# Patient Record
Sex: Female | Born: 1965 | Race: White | Hispanic: No | Marital: Married | Smoking: Current every day smoker
Health system: Southern US, Community
[De-identification: ages and names within clinical notes are randomized; demographics above are authoritative.]

## PROBLEM LIST (undated history)

## (undated) HISTORY — PX: WISDOM TOOTH EXTRACTION: SHX21

---

## 2006-11-05 ENCOUNTER — Emergency Department: Payer: Self-pay | Admitting: Emergency Medicine

## 2007-06-26 ENCOUNTER — Ambulatory Visit: Payer: Self-pay | Admitting: Obstetrics & Gynecology

## 2010-06-20 ENCOUNTER — Ambulatory Visit: Payer: Self-pay | Admitting: Family Medicine

## 2011-08-14 ENCOUNTER — Ambulatory Visit: Payer: Self-pay | Admitting: Family Medicine

## 2014-04-04 ENCOUNTER — Ambulatory Visit: Payer: Self-pay | Admitting: Physician Assistant

## 2014-06-04 ENCOUNTER — Ambulatory Visit: Payer: Self-pay | Admitting: Obstetrics and Gynecology

## 2015-03-11 ENCOUNTER — Other Ambulatory Visit: Payer: Self-pay | Admitting: Obstetrics and Gynecology

## 2015-03-11 DIAGNOSIS — Z1231 Encounter for screening mammogram for malignant neoplasm of breast: Secondary | ICD-10-CM

## 2015-04-05 ENCOUNTER — Ambulatory Visit
Admission: EM | Admit: 2015-04-05 | Discharge: 2015-04-05 | Disposition: A | Discharge status: Home / Self Care | Payer: PRIVATE HEALTH INSURANCE | Attending: Family Medicine | Admitting: Family Medicine

## 2015-04-05 ENCOUNTER — Encounter: Payer: Self-pay | Admitting: *Deleted

## 2015-04-05 DIAGNOSIS — J0101 Acute recurrent maxillary sinusitis: Secondary | ICD-10-CM

## 2015-04-05 LAB — RAPID STREP SCREEN (MED CTR MEBANE ONLY): Streptococcus, Group A Screen (Direct): NEGATIVE

## 2015-04-05 MED ORDER — AMOXICILLIN-POT CLAVULANATE 875-125 MG PO TABS: 1.0000 | ORAL_TABLET | Freq: Two times a day (BID) | ORAL | Status: DC

## 2015-04-05 MED ORDER — FLUTICASONE PROPIONATE 50 MCG/ACT NA SUSP: 2.0000 | Freq: Every day | NASAL | Status: DC

## 2015-04-05 NOTE — ED Provider Notes (Signed)
[facility] Emergency Department Provider Note  ____________________________________________  Time seen: Approximately 8:03 PM  I have reviewed the triage vital signs and the nursing notes.   HISTORY  Chief Complaint Sore Throat; Otalgia; and Headache   HPI Sierra Hart is a 49 y.o. female presents for complaints of runny nose, congestion, bilateral earache and sinus pressure. States present 1 week. States history of similar one month ago and was treated with oral Z-Pak which resolved symptoms. States for the most part symptoms fully resolved after Z-Pak. States last week symptoms started with runny nose and then has progressed to sinus pressure and pain. Patient does report some history of seasonal allergies and has been taking over-the-counter Claritin with some help. Denies fever but reports this felt warm with chills. States occasional cough. His cough is primarily at night with drainage on back of throat. Denies chest pain, shortness of breath, wheezing or fever. Reports continues to eat and drink well. States current sinus pain is described as a pressure at 5 out of 10. States right ear is comfort is a aching pain at 3 out of 10. Reports thick purulent green nasal drainage.   History reviewed. No pertinent past medical history.  There are no active problems to display for this patient.   History reviewed. No pertinent past surgical history.  No current outpatient prescriptions on file.  Allergies Review of patient's allergies indicates no known allergies.  No family history on file.  Social History Social History  Substance Use Topics  . Smoking status: Current Every Day Smoker  . Smokeless tobacco: None  . Alcohol Use: Yes    Review of Systems Constitutional: No fever/chills Eyes: No visual changes. ENT: positive for runny nose, congestion, sore throat and intermittent cough. Cardiovascular: Denies chest pain. Respiratory: Denies  shortness of breath. Gastrointestinal: No abdominal pain.  No nausea, no vomiting.  No diarrhea.  No constipation. Genitourinary: Negative for dysuria. Musculoskeletal: Negative for back pain. Skin: Negative for rash. Neurological: Negative for headaches, focal weakness or numbness.  10-point ROS otherwise negative.  ____________________________________________   PHYSICAL EXAM:  VITAL SIGNS: ED Triage Vitals  Enc Vitals Group     BP 04/05/15 1830 144/78 mmHg     Pulse Rate 04/05/15 1830 61     Resp -- 18     Temp 04/05/15 1830 98.1 F (36.7 C)     Temp Source 04/05/15 1830 Oral     SpO2 04/05/15 1830 100 %     Weight 04/05/15 1830 146 lb (66.225 kg)     Height 04/05/15 1830  (1.702 m)     Head Cir --      Peak Flow --      Pain Score --      Pain Loc --      Pain Edu? --      Excl. in GC? --     Constitutional: Alert and oriented. Well appearing and in no acute distress. Eyes: Conjunctivae are normal. PERRL. EOMI. Head: Atraumatic.mod TTP maxillary sinus, no swelling or erythema.   Ears: no erythema, normal TMs bilaterally.   Nose: mild clear rhinorrhea, bilateral nasal turbinate edema and mild erythema, bilateral nares patent.   Mouth/Throat: Mucous membranes are moist.  Mild pharyngeal erythema. No tonsillar swelling or exudate.  Neck: No stridor.  No cervical spine tenderness to palpation. Hematological/Lymphatic/Immunilogical: No cervical lymphadenopathy. Cardiovascular: Normal rate, regular rhythm. Grossly normal heart sounds.  Good peripheral circulation. Respiratory: Normal respiratory effort.  No retractions. Lungs  CTAB. Gastrointestinal: Soft and nontender. No distention. Normal Bowel sounds.  No abdominal bruits. No CVA tenderness. Musculoskeletal: No lower or upper extremity tenderness nor edema.  No joint effusions. Bilateral pedal pulses equal and easily palpated.  Neurologic:  Normal speech and language. No gross focal neurologic deficits are  appreciated. No gait instability. Skin:  Skin is warm, dry and intact. No rash noted. Psychiatric: Mood and affect are normal. Speech and behavior are normal.  ____________________________________________   LABS (all labs ordered are listed, but only abnormal results are displayed)  Labs Reviewed  RAPID STREP SCREEN (NOT AT [facility])  CULTURE, GROUP A STREP ([facility] ONLY)   _ INITIAL IMPRESSION / ASSESSMENT AND PLAN / ED COURSE  Pertinent labs & imaging results that were available during my care of the patient were reviewed by me and considered in my medical decision making (see chart for details).  Very well appearing. No acute distress. Continues to eat and drink well. Will treat with oral augmentin and fluticasone. Discussed supportive treatments, rest fluids and follow up with PCP. Patient verbalized understanding and agreed to plan.  ____________________________________________   FINAL CLINICAL IMPRESSION(S) / ED DIAGNOSES  Final diagnoses:  Acute recurrent maxillary sinusitis       Renford Dills, NP 04/05/15 2018

## 2015-04-05 NOTE — Discharge Instructions (Signed)
Take medication as prescribed. Rest. Fluids. Follow up with your primary care physician as needed. Return to Urgent care for new or worsening concerns.   Sinusitis Sinusitis is redness, soreness, and puffiness (inflammation) of the air pockets in the bones of your face (sinuses). The redness, soreness, and puffiness can cause air and mucus to get trapped in your sinuses. This can allow germs to grow and cause an infection.  HOME CARE   Drink enough fluids to keep your pee (urine) clear or pale yellow.  Use a humidifier in your home.  Run a hot shower to create steam in the bathroom. Sit in the bathroom with the door closed. Breathe in the steam 3-4 times a day.  Put a warm, moist washcloth on your face 3-4 times a day, or as told by your doctor.  Use salt water sprays (saline sprays) to wet the thick fluid in your nose. This can help the sinuses drain.  Only take medicine as told by your doctor. GET HELP RIGHT AWAY IF:   Your pain gets worse.  You have very bad headaches.  You are sick to your stomach (nauseous).  You throw up (vomit).  You are very sleepy (drowsy) all the time.  Your face is puffy (swollen).  Your vision changes.  You have a stiff neck.  You have trouble breathing. MAKE SURE YOU:   Understand these instructions.  Will watch your condition.  Will get help right away if you are not doing well or get worse. Document Released: 12/26/2007 Document Revised: 04/02/2012 Document Reviewed: 02/12/2012 [facility] Patient Information 2015 [place], [place]. This information is not intended to replace advice given to you by your health care provider. Make sure you discuss any questions you have with your health care provider.

## 2015-04-05 NOTE — ED Notes (Signed)
Pt states that she has been having sore throat, headache and earache started last week.  Was given a zpack on the aug 18th by her PCP.

## 2015-04-07 LAB — CULTURE, GROUP A STREP (THRC)

## 2020-11-03 DIAGNOSIS — Z01411 Encounter for gynecological examination (general) (routine) with abnormal findings: Secondary | ICD-10-CM | POA: Diagnosis not present

## 2020-11-03 DIAGNOSIS — R87811 Vaginal high risk human papillomavirus (HPV) DNA test positive: Secondary | ICD-10-CM | POA: Diagnosis not present

## 2020-11-03 DIAGNOSIS — R87610 Atypical squamous cells of undetermined significance on cytologic smear of cervix (ASC-US): Secondary | ICD-10-CM | POA: Diagnosis not present

## 2020-11-03 DIAGNOSIS — N870 Mild cervical dysplasia: Secondary | ICD-10-CM | POA: Diagnosis not present

## 2020-11-15 DIAGNOSIS — Z01419 Encounter for gynecological examination (general) (routine) without abnormal findings: Secondary | ICD-10-CM | POA: Diagnosis not present

## 2020-12-08 DIAGNOSIS — Z13220 Encounter for screening for lipoid disorders: Secondary | ICD-10-CM | POA: Diagnosis not present

## 2020-12-08 DIAGNOSIS — Z1329 Encounter for screening for other suspected endocrine disorder: Secondary | ICD-10-CM | POA: Diagnosis not present

## 2020-12-08 DIAGNOSIS — Z131 Encounter for screening for diabetes mellitus: Secondary | ICD-10-CM | POA: Diagnosis not present

## 2020-12-08 DIAGNOSIS — Z1321 Encounter for screening for nutritional disorder: Secondary | ICD-10-CM | POA: Diagnosis not present

## 2021-03-28 ENCOUNTER — Ambulatory Visit
Admission: EM | Admit: 2021-03-28 | Discharge: 2021-03-28 | Disposition: A | Discharge status: Home / Self Care | Payer: BC Managed Care – PPO | Attending: Family | Admitting: Family

## 2021-03-28 ENCOUNTER — Other Ambulatory Visit: Payer: Self-pay

## 2021-03-28 DIAGNOSIS — R112 Nausea with vomiting, unspecified: Secondary | ICD-10-CM

## 2021-03-28 DIAGNOSIS — R202 Paresthesia of skin: Secondary | ICD-10-CM | POA: Diagnosis not present

## 2021-03-28 DIAGNOSIS — R42 Dizziness and giddiness: Secondary | ICD-10-CM | POA: Diagnosis not present

## 2021-03-28 DIAGNOSIS — R200 Anesthesia of skin: Secondary | ICD-10-CM | POA: Diagnosis not present

## 2021-03-28 MED ORDER — ONDANSETRON 8 MG PO TBDP: 8.0000 mg | ORAL_TABLET | Freq: Three times a day (TID) | ORAL | 0 refills | Status: AC | PRN

## 2021-03-28 MED ORDER — ONDANSETRON 8 MG PO TBDP
8.0000 mg | ORAL_TABLET | Freq: Once | ORAL | Status: AC
  Administered 2021-03-28: 8 mg via ORAL

## 2021-03-28 NOTE — Discharge Instructions (Addendum)
Recommend continue to monitor symptoms. Change positions slowly. May continue Zofran 8mg  every 8 hours as needed for nausea/vomiting. Try to push water and Powerade/Gatorade. If numbness continues, dizziness worsens and headache develops or change in vision and unable to keep fluids or food down, call 911 and go to the ER ASAP. Otherwise, follow-up with your PCP to discuss further testing and evaluation which may include imaging.

## 2021-03-28 NOTE — ED Triage Notes (Addendum)
Pt c/o left hand tingling, lip numbness, some stinging in the inner corner of her right eye, dizziness - symptoms have been progressing since about 9am today. Pt also reports some nausea and emesis about lunchtime. Pt went to the [facility] and was advised to come to UC.

## 2021-03-28 NOTE — ED Provider Notes (Signed)
[facility]    CSN: [id number] Arrival date & time: 03/28/21  1523      History   Chief Complaint Chief Complaint  Patient presents with   Dizziness   Numbness    Lips and left hand    HPI Sierra EkeKelly Amber Hart is a 55 y.o. female.   55 year old female presents with intermittent numbness and tingling of her right face, left hand and dizziness that started this morning.  First experienced some stinging and numbness in the corner of her right eye which has occurred once before.  Then noticed the numbness was spreading down along her nose to the right side of her lip.  That has improved but still feels lip is irritated and swollen.  Then started experiencing left hand tingling and numbness that also is slightly better but intermittent.  Next started experiencing more dizziness especially when moving or changing positions.  The symptoms started when she was drinking coffee at a shop near her work.  By lunchtime she started experiencing more nausea and vomited her lunch.  [facility] and they told her to come to urgent care or the hospital.  She denies any headache, change in vision, tinnitus, nasal congestion, chest pain, shortness of breath, lower extremity numbness or tingling. No previous similar symptoms except for the right inner eye irritation. She recently had routine lab work with her GYN which was all normal except slightly elevated lipids. She does smoke tobacco but no alcohol or illicit drug use. No recent travel. No recent vaccines. Still feels nauseous this afternoon and her son drove her here. Does have positive family history for stoke in mom and brother along with HTN. No other chronic health issues. Takes no daily medication.   The history is provided by the patient.   No past medical history on file.  There are no problems to display for this patient.   No past surgical history on file.  OB History   No obstetric history on  file.      Home Medications    Prior to Admission medications   Medication Sig Start Date End Date Taking? Authorizing Provider  ondansetron (ZOFRAN ODT) 8 MG disintegrating tablet Take 1 tablet (8 mg total) by mouth every 8 (eight) hours as needed for nausea or vomiting. 03/28/21  Yes Glessie Eustice, Ali LoweAnn Berry, NP    Family History No family history on file.  Social History Social History   Tobacco Use   Smoking status: Every Day    Types: Cigarettes   Smokeless tobacco: Never  Vaping Use   Vaping Use: Never used  Substance Use Topics   Alcohol use: Yes     Allergies   Patient has no known allergies.   Review of Systems Review of Systems  Constitutional:  Positive for appetite change. Negative for chills, diaphoresis, fatigue and fever.  HENT:  Negative for congestion, dental problem, drooling, ear discharge, ear pain, facial swelling, hearing loss, mouth sores, nosebleeds, postnasal drip, rhinorrhea, sinus pressure, sinus pain, sore throat, tinnitus and trouble swallowing.   Eyes:  Positive for pain (right corner of eye "stinging"). Negative for photophobia, discharge, redness, itching and visual disturbance.  Respiratory:  Negative for cough, chest tightness, shortness of breath and wheezing.   Cardiovascular:  Negative for chest pain, palpitations and leg swelling.  Gastrointestinal:  Positive for nausea and vomiting. Negative for abdominal pain.  Musculoskeletal:  Positive for gait problem. Negative for arthralgias,  back pain and myalgias.  Skin:  Negative for color change and rash.  Allergic/Immunologic: Negative for environmental allergies, food allergies and immunocompromised state.  Neurological:  Positive for dizziness and numbness. Negative for tremors, seizures, syncope, facial asymmetry, speech difficulty, weakness and headaches.  Hematological:  Negative for adenopathy. Does not bruise/bleed easily.    Physical Exam Triage Vital Signs ED Triage Vitals  Enc Vitals  Group     BP 03/28/21 1558 (!) 156/86     Pulse Rate 03/28/21 1558 88     Resp 03/28/21 1558 18     Temp 03/28/21 1558 98.3 F (36.8 C)     Temp Source 03/28/21 1558 Oral     SpO2 03/28/21 1558 91 %     Weight 03/28/21 1556 165 lb (74.8 kg)     Height 03/28/21 1556 5\' 6"  (1.676 m)     Head Circumference --      Peak Flow --      Pain Score 03/28/21 1556 6     Pain Loc --      Pain Edu? --      Excl. in GC? --    No data found.  Updated Vital Signs BP 128/81 (BP Location: Right Arm)   Pulse 88   Temp 98.3 F (36.8 C) (Oral)   Resp 18   Ht 5\' 6"  (1.676 m)   Wt 165 lb (74.8 kg)   SpO2 91%   BMI 26.63 kg/m  Rechecked SpO2 level at 1640 and was 95% in right index finger and 96% in left index finger.   Visual Acuity Right Eye Distance:   Left Eye Distance:   Bilateral Distance:    Right Eye Near:   Left Eye Near:    Bilateral Near:     Physical Exam Vitals and nursing note reviewed.  Constitutional:      General: She is awake. She is not in acute distress.    Appearance: She is well-developed and well-groomed. She is not ill-appearing.     Comments: She is sitting comfortably in a wheel chair in no acute distress, talking in complete sentences.   HENT:     Head: Normocephalic and atraumatic.     Right Ear: Hearing, tympanic membrane, ear canal and external ear normal.     Left Ear: Hearing, tympanic membrane, ear canal and external ear normal.     Nose: Nose normal.     Right Sinus: No maxillary sinus tenderness or frontal sinus tenderness.     Left Sinus: No maxillary sinus tenderness or frontal sinus tenderness.     Mouth/Throat:     Lips: Pink.     Mouth: Mucous membranes are moist. No oral lesions.     Pharynx: Oropharynx is clear. Uvula midline. No pharyngeal swelling, oropharyngeal exudate, posterior oropharyngeal erythema or uvula swelling.      Comments: Slight redness/irritation from rubbing her upper lip due to previous numbness.  Eyes:     General:  Lids are normal. Vision grossly intact. No visual field deficit.    Extraocular Movements: Extraocular movements intact.     Conjunctiva/sclera: Conjunctivae normal.     Pupils: Pupils are equal, round, and reactive to light.  Neck:     Vascular: No carotid bruit.  Cardiovascular:     Rate and Rhythm: Normal rate and regular rhythm.     Pulses: Normal pulses.     Heart sounds: Normal heart sounds. No murmur heard. Pulmonary:     Effort: Pulmonary effort is normal.  No respiratory distress.     Breath sounds: Normal breath sounds and air entry. No decreased air movement. No decreased breath sounds, wheezing, rhonchi or rales.  Musculoskeletal:        General: Normal range of motion.     Right hand: Normal. No tenderness. Normal range of motion. Normal strength. Normal sensation.     Left hand: Normal. No tenderness. Normal range of motion. Normal strength. Normal sensation.     Cervical back: Normal range of motion and neck supple. No rigidity or tenderness.     Comments: No neuro deficits noted in left hand/arm or fingers.   Lymphadenopathy:     Cervical: No cervical adenopathy.  Skin:    General: Skin is warm and dry.     Capillary Refill: Capillary refill takes less than 2 seconds.     Findings: No rash.  Neurological:     General: No focal deficit present.     Mental Status: She is alert and oriented to person, place, and time.     Cranial Nerves: Cranial nerves are intact. No cranial nerve deficit or facial asymmetry.     Sensory: Sensation is intact. No sensory deficit.     Motor: Motor function is intact. No weakness or tremor.     Coordination: Coordination is intact. Coordination normal.     Gait: Gait abnormal.     Deep Tendon Reflexes: Reflexes are normal and symmetric.     Comments: She needs assistance with changing positions due to dizziness and unstable gait but otherwise no neuro deficits noted.   Psychiatric:        Mood and Affect: Mood normal.        Behavior:  Behavior normal. Behavior is cooperative.        Thought Content: Thought content normal.        Judgment: Judgment normal.     UC Treatments / Results  Labs (all labs ordered are listed, but only abnormal results are displayed) Labs Reviewed - No data to display  EKG   Radiology No results found.  Procedures Procedures (including critical care time)  Medications Ordered in UC Medications  ondansetron (ZOFRAN-ODT) disintegrating tablet 8 mg (8 mg Oral Given 03/28/21 1706)    Initial Impression / Assessment and Plan / UC Course  I have reviewed the triage vital signs and the nursing notes.  Pertinent labs & imaging results that were available during my care of the patient were reviewed by me and considered in my medical decision making (see chart for details).     Gave Zofran 8mg  now to help with nausea. Patient indicated that she still feels dizzy but nausea has improved after 15-20 minutes. Reviewed with patient uncertain of etiology of her symptoms. Doubt stoke, especially since she is experiencing symptoms in various locations on both sides of her body and are not progressing. No history of diabetes and her recent lab work per patient was reassuring (unable to pull up lab results in Epic). After 5pm now and unable to perform any in-house STAT blood work. She is stable with blood pressure improved and similar in both arms. Oxygen saturation level is now normal. She continues to talk in complete sentences and is in no distress. Discussed that she may need further evaluation/imaging if symptoms continue. Recommend continue to monitor symptoms for now. Change positions slowly. Try to push water/fluids and Powerade/Gatorade to stay well hydrated. May continue Zofran 8mg  ODT every 8 hours as needed for nausea. If numbness continues, dizziness  worsens or any headache, change in vision occurs or unable to keep down any fluids, call 911 and go to the ER ASAP. Otherwise, follow-up/call your  PCP tomorrow to discuss further evaluation and possible additional testing/imaging to help determine etiology of symptoms.  Final Clinical Impressions(s) / UC Diagnoses   Final diagnoses:  Dizziness  Numbness of lip  Nausea and vomiting, intractability of vomiting not specified, unspecified vomiting type  Paresthesias in left hand     Discharge Instructions      Recommend continue to monitor symptoms. Change positions slowly. May continue Zofran 8mg  every 8 hours as needed for nausea/vomiting. Try to push water and Powerade/Gatorade. If numbness continues, dizziness worsens and headache develops or change in vision and unable to keep fluids or food down, call 911 and go to the ER ASAP. Otherwise, follow-up with your PCP to discuss further testing and evaluation which may include imaging.      ED Prescriptions     Medication Sig Dispense Auth. Provider   ondansetron (ZOFRAN ODT) 8 MG disintegrating tablet Take 1 tablet (8 mg total) by mouth every 8 (eight) hours as needed for nausea or vomiting. 15 tablet Velton Roselle, , NP      PDMP not reviewed this encounter.   Ali Lowe, NP 03/29/21 1046

## 2021-03-30 DIAGNOSIS — R202 Paresthesia of skin: Secondary | ICD-10-CM | POA: Diagnosis not present

## 2021-05-09 DIAGNOSIS — E782 Mixed hyperlipidemia: Secondary | ICD-10-CM | POA: Diagnosis not present

## 2021-07-31 DIAGNOSIS — R202 Paresthesia of skin: Secondary | ICD-10-CM | POA: Diagnosis not present

## 2021-08-16 ENCOUNTER — Other Ambulatory Visit: Payer: Self-pay | Admitting: Obstetrics and Gynecology

## 2021-08-16 ENCOUNTER — Ambulatory Visit
Admission: RE | Admit: 2021-08-16 | Discharge: 2021-08-16 | Disposition: A | Discharge status: Home / Self Care | Payer: PRIVATE HEALTH INSURANCE | Source: Ambulatory Visit | Attending: Obstetrics and Gynecology | Admitting: Obstetrics and Gynecology

## 2021-08-16 ENCOUNTER — Other Ambulatory Visit: Payer: Self-pay

## 2021-08-16 DIAGNOSIS — Z1231 Encounter for screening mammogram for malignant neoplasm of breast: Secondary | ICD-10-CM

## 2021-10-31 DIAGNOSIS — Z01419 Encounter for gynecological examination (general) (routine) without abnormal findings: Secondary | ICD-10-CM | POA: Diagnosis not present

## 2021-10-31 DIAGNOSIS — I890 Lymphedema, not elsewhere classified: Secondary | ICD-10-CM | POA: Diagnosis not present

## 2021-10-31 DIAGNOSIS — R87610 Atypical squamous cells of undetermined significance on cytologic smear of cervix (ASC-US): Secondary | ICD-10-CM | POA: Diagnosis not present

## 2021-11-01 DIAGNOSIS — Z124 Encounter for screening for malignant neoplasm of cervix: Secondary | ICD-10-CM | POA: Diagnosis not present

## 2021-12-22 ENCOUNTER — Other Ambulatory Visit (INDEPENDENT_AMBULATORY_CARE_PROVIDER_SITE_OTHER): Payer: Self-pay | Admitting: Nurse Practitioner

## 2021-12-22 DIAGNOSIS — R600 Localized edema: Secondary | ICD-10-CM

## 2021-12-22 DIAGNOSIS — I83813 Varicose veins of bilateral lower extremities with pain: Secondary | ICD-10-CM

## 2021-12-25 ENCOUNTER — Encounter (INDEPENDENT_AMBULATORY_CARE_PROVIDER_SITE_OTHER): Payer: Self-pay | Admitting: Vascular Surgery

## 2021-12-25 ENCOUNTER — Ambulatory Visit (INDEPENDENT_AMBULATORY_CARE_PROVIDER_SITE_OTHER): Payer: BC Managed Care – PPO

## 2021-12-25 ENCOUNTER — Ambulatory Visit (INDEPENDENT_AMBULATORY_CARE_PROVIDER_SITE_OTHER): Payer: BC Managed Care – PPO | Admitting: Vascular Surgery

## 2021-12-25 DIAGNOSIS — I872 Venous insufficiency (chronic) (peripheral): Secondary | ICD-10-CM | POA: Diagnosis not present

## 2021-12-25 DIAGNOSIS — I83813 Varicose veins of bilateral lower extremities with pain: Secondary | ICD-10-CM

## 2021-12-25 DIAGNOSIS — R600 Localized edema: Secondary | ICD-10-CM | POA: Diagnosis not present

## 2021-12-25 DIAGNOSIS — I890 Lymphedema, not elsewhere classified: Secondary | ICD-10-CM | POA: Diagnosis not present

## 2021-12-25 DIAGNOSIS — I83819 Varicose veins of unspecified lower extremities with pain: Secondary | ICD-10-CM | POA: Diagnosis not present

## 2021-12-25 NOTE — Progress Notes (Signed)
MRN : [id number]  Sierra Hart is a 56 y.o. (01-13-1966) female who presents with chief complaint of legs hurt and swell.  History of Present Illness:   Patient is seen for evaluation of leg pain and leg swelling. The patient first noticed the swelling remotely. The swelling is associated with pain and discoloration. The pain and swelling worsens with prolonged dependency and improves with elevation. The pain is unrelated to activity.  The patient notes that in the morning the legs are significantly improved but they steadily worsened throughout the course of the day. The patient also notes a steady worsening of the discoloration in the ankle and shin area.   The patient denies claudication symptoms.  The patient denies symptoms consistent with rest pain.  The patient has a past history of LS spine disease, but this has not been a huge problem for her.  The patient has no had any past angiography, interventions or vascular surgery.  Elevation makes the leg symptoms better, dependency makes them much worse. There is no history of ulcerations. The patient denies any recent changes in medications.  The patient has tried wearing graduated compression but found they created pain at the top of the cuff area.  The patient denies a history of DVT or PE. There is no prior history of phlebitis. There is no history of primary lymphedema.  No history of malignancies. No history of trauma or groin or pelvic surgery. There is no history of radiation treatment to the groin or pelvis  No history of CAD The patient denies recent episodes of angina or shortness of breath.  Venous duplex shows a normal deep system bilaterally with mild reflux in the right GSV and severe reflux in the left GSV    No past medical history on file.  No past surgical history on file.  Social History Social History   Tobacco Use   Smoking status: Every Day    Types: Cigarettes   Smokeless tobacco: Never   Vaping Use   Vaping Use: Never used  Substance Use Topics   Alcohol use: Yes    Family History No family history on file.  No Known Allergies   REVIEW OF SYSTEMS (Negative unless checked)  Constitutional: [] Weight loss  [] Fever  [] Chills Cardiac: [] Chest pain   [] Chest pressure   [] Palpitations   [] Shortness of breath when laying flat   [] Shortness of breath with exertion. Vascular:  [] Pain in legs with walking   [x] Pain in legs at rest  [] History of DVT   [] Phlebitis   [x] Swelling in legs   [x] Varicose veins   [] Non-healing ulcers Pulmonary:   [] Uses home oxygen   [] Productive cough   [] Hemoptysis   [] Wheeze  [] COPD   [] Asthma Neurologic:  [] Dizziness   [] Seizures   [] History of stroke   [] History of TIA  [] Aphasia   [] Vissual changes   [] Weakness or numbness in arm   [] Weakness or numbness in leg Musculoskeletal:   [] Joint swelling   [] Joint pain   [] Low back pain Hematologic:  [] Easy bruising  [] Easy bleeding   [] Hypercoagulable state   [] Anemic Gastrointestinal:  [] Diarrhea   [] Vomiting  [] Gastroesophageal reflux/heartburn   [] Difficulty swallowing. Genitourinary:  [] Chronic kidney disease   [] Difficult urination  [] Frequent urination   [] Blood in urine Skin:  [] Rashes   [] Ulcers  Psychological:  [] History of anxiety   []  History of major depression.  Physical Examination  There were no vitals filed for this visit. There is no  height or weight on file to calculate BMI. Gen: WD/WN, NAD Head: [place]/AT, No temporalis wasting.  Ear/Nose/Throat: Hearing grossly intact, nares w/o erythema or drainage, pinna without lesions Eyes: PER, EOMI, sclera nonicteric.  Neck: Supple, no gross masses.  No JVD.  Pulmonary:  Good air movement, no audible wheezing, no use of accessory muscles.  Cardiac: RRR, precordium not hyperdynamic. Vascular:  scattered varicosities present bilaterally.  Moderate venous stasis changes to the legs bilaterally.  2-3+ soft pitting edema left leg greater than  right Vessel Right Left  Radial Palpable Palpable  PT Palpable  Palpable   Gastrointestinal: soft, non-distended. No guarding/no peritoneal signs.  Musculoskeletal: M/S 5/5 throughout.  No deformity.  Neurologic: CN 2-12 intact. Pain and light touch intact in extremities.  Symmetrical.  Speech is fluent. Motor exam as listed above. Psychiatric: Judgment intact, Mood & affect appropriate for pt's clinical situation. Dermatologic: Venous rashes no ulcers noted.  No changes consistent with cellulitis. Lymph : No lichenification or skin changes of chronic lymphedema.  CBC No results found for: WBC, HGB, HCT, MCV, PLT  BMET No results found for: NA, K, CL, CO2, GLUCOSE, BUN, CREATININE, CALCIUM, GFRNONAA, GFRAA CrCl cannot be calculated (No successful lab value found.).  COAG No results found for: INR, PROTIME  Radiology No results found.   Assessment/Plan 1. Varicose veins with pain  Recommend:  The patient is complaining of symptomatic varicose veins associated with venous insufficiency.  The patient describes them as painful, associated with swelling of both legs and ankles, left leg more than right.  The patient notes  this is interfering with daily activities and lifestyle.  I have had a long discussion with the patient regarding  varicose veins and why they cause symptoms as well as the mechanics of venous insufficiency and lymphedema.  Patient will retry to wear graduated compression stockings on a daily basis, beginning first thing in the morning and removing them in the evening. The patient is instructed specifically not to sleep in the stockings.    The patient  will also begin using over-the-counter analgesics such as Motrin 600 mg po TID to help control the symptoms.    In addition, behavioral modification including elevation during the day will be initiated.  The patient is also instructed to continue exercising, such as walking, 4-5 times per week.  Venous duplex shows a  normal deep system bilaterally with mild reflux in the right GSV and severe reflux in the left GSV   Pending the results of these changes the  patient will be reevaluated in three months.  Further plans will be based on the benefits of conservative therapy and the ultrasound results.    2. Chronic venous insufficiency Recommend:  The patient is complaining of symptomatic varicose veins associated with venous insufficiency.  The patient describes them as painful, associated with swelling of both legs and ankles, left leg more than right.  The patient notes  this is interfering with daily activities and lifestyle.  I have had a long discussion with the patient regarding  varicose veins and why they cause symptoms as well as the mechanics of venous insufficiency and lymphedema.  Patient will retry to wear graduated compression stockings on a daily basis, beginning first thing in the morning and removing them in the evening. The patient is instructed specifically not to sleep in the stockings.    The patient  will also begin using over-the-counter analgesics such as Motrin 600 mg po TID to help control the symptoms.  In addition, behavioral modification including elevation during the day will be initiated.  The patient is also instructed to continue exercising, such as walking, 4-5 times per week.  Venous duplex shows a normal deep system bilaterally with mild reflux in the right GSV and severe reflux in the left GSV   Pending the results of these changes the  patient will be reevaluated in three months.  Further plans will be based on the benefits of conservative therapy and the ultrasound results.  3. Lymphedema Recommend:  The patient is complaining of symptomatic varicose veins associated with venous insufficiency.  The patient describes them as painful, associated with swelling of both legs and ankles, left leg more than right.  The patient notes  this is interfering with daily activities  and lifestyle.  I have had a long discussion with the patient regarding  varicose veins and why they cause symptoms as well as the mechanics of venous insufficiency and lymphedema.  Patient will retry to wear graduated compression stockings on a daily basis, beginning first thing in the morning and removing them in the evening. The patient is instructed specifically not to sleep in the stockings.    The patient  will also begin using over-the-counter analgesics such as Motrin 600 mg po TID to help control the symptoms.    In addition, behavioral modification including elevation during the day will be initiated.  The patient is also instructed to continue exercising, such as walking, 4-5 times per week.  Venous duplex shows a normal deep system bilaterally with mild reflux in the right GSV and severe reflux in the left GSV   Pending the results of these changes the  patient will be reevaluated in three months.  Consideration for a lymph pump can be made in the future as needed.  Further plans will be based on the benefits of conservative therapy and the ultrasound results.    Levora Dredge, MD  12/25/2021 8:35 AM

## 2022-03-09 DIAGNOSIS — Z1211 Encounter for screening for malignant neoplasm of colon: Secondary | ICD-10-CM | POA: Diagnosis not present

## 2022-03-09 DIAGNOSIS — K5909 Other constipation: Secondary | ICD-10-CM | POA: Diagnosis not present

## 2022-04-01 NOTE — Progress Notes (Unsigned)
MRN : [id number]  Sierra Hart is a 56 y.o. ([birth year]) female who presents with chief complaint of legs swell.  History of Present Illness: ***  No outpatient medications have been marked as taking for the 04/02/22 encounter (Appointment) with Gilda Crease, Latina Craver, MD.    No past medical history on file.  Past Surgical History:  Procedure Laterality Date   WISDOM TOOTH EXTRACTION      Social History Social History   Tobacco Use   Smoking status: Every Day    Types: Cigarettes   Smokeless tobacco: Never  Vaping Use   Vaping Use: Never used  Substance Use Topics   Alcohol use: Yes   Drug use: Not Currently    Family History Family History  Problem Relation Age of Onset   Stroke Mother    Diabetes Mother    Dementia Mother    Heart attack Mother    Heart attack Father    Diabetes Sister    Diabetes Brother     No Known Allergies   REVIEW OF SYSTEMS (Negative unless checked)  Constitutional: [] Weight loss  [] Fever  [] Chills Cardiac: [] Chest pain   [] Chest pressure   [] Palpitations   [] Shortness of breath when laying flat   [] Shortness of breath with exertion. Vascular:  [] Pain in legs with walking   [x] Pain in legs with standing  [] History of DVT   [] Phlebitis   [x] Swelling in legs   [] Varicose veins   [] Non-healing ulcers Pulmonary:   [] Uses home oxygen   [] Productive cough   [] Hemoptysis   [] Wheeze  [] COPD   [] Asthma Neurologic:  [] Dizziness   [] Seizures   [] History of stroke   [] History of TIA  [] Aphasia   [] Vissual changes   [] Weakness or numbness in arm   [] Weakness or numbness in leg Musculoskeletal:   [] Joint swelling   [] Joint pain   [] Low back pain Hematologic:  [] Easy bruising  [] Easy bleeding   [] Hypercoagulable state   [] Anemic Gastrointestinal:  [] Diarrhea   [] Vomiting  [] Gastroesophageal reflux/heartburn   [] Difficulty swallowing. Genitourinary:  [] Chronic kidney disease   [] Difficult urination  [] Frequent urination   [] Blood in  urine Skin:  [] Rashes   [] Ulcers  Psychological:  [] History of anxiety   []  History of major depression.  Physical Examination  There were no vitals filed for this visit. There is no height or weight on file to calculate BMI. Gen: WD/WN, NAD Head: [place]/AT, No temporalis wasting.  Ear/Nose/Throat: Hearing grossly intact, nares w/o erythema or drainage, pinna without lesions Eyes: PER, EOMI, sclera nonicteric.  Neck: Supple, no gross masses.  No JVD.  Pulmonary:  Good air movement, no audible wheezing, no use of accessory muscles.  Cardiac: RRR, precordium not hyperdynamic. Vascular:  scattered varicosities present bilaterally.  Mild venous stasis changes to the legs bilaterally.  3-4+ soft pitting edema  Vessel Right Left  Radial Palpable Palpable  Gastrointestinal: soft, non-distended. No guarding/no peritoneal signs.  Musculoskeletal: M/S 5/5 throughout.  No deformity.  Neurologic: CN 2-12 intact. Pain and light touch intact in extremities.  Symmetrical.  Speech is fluent. Motor exam as listed above. Psychiatric: Judgment intact, Mood & affect appropriate for pt's clinical situation. Dermatologic: Venous rashes no ulcers noted.  No changes consistent with cellulitis. Lymph : No lichenification or skin changes of chronic lymphedema.  CBC No results found for: "WBC", "HGB", "HCT", "MCV", "PLT"  BMET No results found for: "NA", "K", "CL", "CO2", "GLUCOSE", "BUN", "CREATININE", "CALCIUM", "GFRNONAA", "GFRAA" CrCl cannot be calculated (  No successful lab value found.).  COAG No results found for: "INR", "PROTIME"  Radiology No results found.   Assessment/Plan 1. Varicose veins with pain ***  2. Chronic venous insufficiency ***  3. Lymphedema ***    Levora Dredge, MD  04/01/2022 7:36 PM

## 2022-04-02 ENCOUNTER — Encounter (INDEPENDENT_AMBULATORY_CARE_PROVIDER_SITE_OTHER): Payer: Self-pay | Admitting: Vascular Surgery

## 2022-04-02 ENCOUNTER — Ambulatory Visit (INDEPENDENT_AMBULATORY_CARE_PROVIDER_SITE_OTHER): Payer: BC Managed Care – PPO | Admitting: Vascular Surgery

## 2022-04-02 VITALS — BP 148/89 | HR 88 | Resp 16 | Wt 152.4 lb

## 2022-04-02 DIAGNOSIS — I83819 Varicose veins of unspecified lower extremities with pain: Secondary | ICD-10-CM | POA: Diagnosis not present

## 2022-04-02 DIAGNOSIS — I872 Venous insufficiency (chronic) (peripheral): Secondary | ICD-10-CM

## 2022-04-02 DIAGNOSIS — I890 Lymphedema, not elsewhere classified: Secondary | ICD-10-CM

## 2022-04-03 ENCOUNTER — Encounter (INDEPENDENT_AMBULATORY_CARE_PROVIDER_SITE_OTHER): Payer: Self-pay | Admitting: Vascular Surgery

## 2022-04-27 DIAGNOSIS — K635 Polyp of colon: Secondary | ICD-10-CM | POA: Diagnosis not present

## 2022-04-27 DIAGNOSIS — D125 Benign neoplasm of sigmoid colon: Secondary | ICD-10-CM | POA: Diagnosis not present

## 2022-04-27 DIAGNOSIS — Z1211 Encounter for screening for malignant neoplasm of colon: Secondary | ICD-10-CM | POA: Diagnosis not present

## 2022-05-21 ENCOUNTER — Encounter (INDEPENDENT_AMBULATORY_CARE_PROVIDER_SITE_OTHER): Payer: Self-pay

## 2022-06-27 NOTE — Progress Notes (Signed)
    MRN : [id number]  Man Bonneau is a 56 y.o. (1966-06-18) female who presents with chief complaint of No chief complaint on file. .    The patient's left lower extremity was sterilely prepped and draped.  The ultrasound machine was used to visualize the left great saphenous vein throughout its course.  A segment below the knee was selected for access.  The saphenous vein was accessed without difficulty using ultrasound guidance with a micropuncture needle.   An 0.018  wire was placed beyond the saphenofemoral junction through the sheath and the microneedle was removed.  The 65 cm sheath was then placed over the wire and the wire and dilator were removed.  The laser fiber was placed through the sheath and its tip was placed approximately 2 cm below the saphenofemoral junction.  Tumescent anesthesia was then created with a dilute lidocaine solution.  Laser energy was then delivered with constant withdrawal of the sheath and laser fiber.  Approximately 1760 Joules of energy were delivered over a length of 37 cm.  Sterile dressings were placed.  The patient tolerated the procedure well without complications.

## 2022-06-28 ENCOUNTER — Encounter (INDEPENDENT_AMBULATORY_CARE_PROVIDER_SITE_OTHER): Payer: Self-pay | Admitting: Vascular Surgery

## 2022-06-28 ENCOUNTER — Ambulatory Visit (INDEPENDENT_AMBULATORY_CARE_PROVIDER_SITE_OTHER): Payer: BC Managed Care – PPO | Admitting: Vascular Surgery

## 2022-06-28 VITALS — BP 150/92 | HR 82 | Resp 16 | Wt 151.6 lb

## 2022-06-28 DIAGNOSIS — I83819 Varicose veins of unspecified lower extremities with pain: Secondary | ICD-10-CM | POA: Diagnosis not present

## 2022-06-28 DIAGNOSIS — I83812 Varicose veins of left lower extremities with pain: Secondary | ICD-10-CM | POA: Diagnosis not present

## 2022-07-01 ENCOUNTER — Encounter (INDEPENDENT_AMBULATORY_CARE_PROVIDER_SITE_OTHER): Payer: Self-pay | Admitting: Vascular Surgery

## 2022-07-03 ENCOUNTER — Other Ambulatory Visit (INDEPENDENT_AMBULATORY_CARE_PROVIDER_SITE_OTHER): Payer: Self-pay | Admitting: Vascular Surgery

## 2022-07-03 DIAGNOSIS — I83813 Varicose veins of bilateral lower extremities with pain: Secondary | ICD-10-CM

## 2022-07-05 ENCOUNTER — Ambulatory Visit (INDEPENDENT_AMBULATORY_CARE_PROVIDER_SITE_OTHER): Payer: BC Managed Care – PPO

## 2022-07-05 DIAGNOSIS — I83813 Varicose veins of bilateral lower extremities with pain: Secondary | ICD-10-CM | POA: Diagnosis not present

## 2022-07-27 ENCOUNTER — Encounter (INDEPENDENT_AMBULATORY_CARE_PROVIDER_SITE_OTHER): Payer: Self-pay | Admitting: Nurse Practitioner

## 2022-07-27 ENCOUNTER — Ambulatory Visit (INDEPENDENT_AMBULATORY_CARE_PROVIDER_SITE_OTHER): Payer: BC Managed Care – PPO | Admitting: Nurse Practitioner

## 2022-07-27 VITALS — BP 167/89 | HR 88 | Resp 16 | Wt 155.0 lb

## 2022-07-27 DIAGNOSIS — I83819 Varicose veins of unspecified lower extremities with pain: Secondary | ICD-10-CM | POA: Diagnosis not present

## 2022-07-27 NOTE — Progress Notes (Signed)
Subjective:    Patient ID: Sierra Hart, female    DOB: 1965-11-27, 57 y.o.   MRN: [id number] Chief Complaint  Patient presents with   Follow-up    4 week post laser     The patient returns to the office for followup status post laser ablation of the left great saphenous vein on 06/28/2022.  The patient note significant improvement in the lower extremity pain but not resolution of the symptoms. The patient notes multiple residual varicosities bilaterally which continued to hurt with dependent positions and remained tender to palpation. The patient's swelling is minimally from preoperative status. The patient continues to wear graduated compression stockings on a daily basis but these are not eliminating the pain and discomfort. The patient continues to use over-the-counter anti-inflammatory medications to treat the pain and related symptoms but this has not given the patient relief. The patient notes the pain in the lower extremities is causing problems with daily exercise, problems at work and even with household activities such as preparing meals and doing dishes.  The patient is otherwise done well and there have been no complications related to the laser procedure or interval changes in the patient's overall   Post laser ultrasound shows successful ablation of the left GSV.      Review of Systems  Cardiovascular:  Positive for leg swelling.  All other systems reviewed and are negative.      Objective:   Physical Exam Vitals reviewed.  HENT:     Head: Normocephalic.  Cardiovascular:     Rate and Rhythm: Normal rate.     Pulses: Normal pulses.  Pulmonary:     Effort: Pulmonary effort is normal.  Skin:    General: Skin is warm and dry.     Comments: Scattered residual varicosities  Neurological:     Mental Status: She is alert and oriented to person, place, and time.  Psychiatric:        Mood and Affect: Mood normal.        Behavior: Behavior normal.         Thought Content: Thought content normal.        Judgment: Judgment normal.     BP (!) 167/89 (BP Location: Left Arm)   Pulse 88   Resp 16   Wt 155 lb (70.3 kg)   BMI 24.28 kg/m   History reviewed. No pertinent past medical history.  Social History   Socioeconomic History   Marital status: Married    Spouse name: Not on file   Number of children: Not on file   Years of education: Not on file   Highest education level: Not on file  Occupational History   Not on file  Tobacco Use   Smoking status: Every Day    Types: Cigarettes   Smokeless tobacco: Never  Vaping Use   Vaping Use: Never used  Substance and Sexual Activity   Alcohol use: Yes   Drug use: Not Currently   Sexual activity: Not on file  Other Topics Concern   Not on file  Social History Narrative   Not on file   Social Determinants of Health   Financial Resource Strain: Not on file  Food Insecurity: Not on file  Transportation Needs: Not on file  Physical Activity: Not on file  Stress: Not on file  Social Connections: Not on file  Intimate Partner Violence: Not on file    Past Surgical History:  Procedure Laterality Date   WISDOM TOOTH EXTRACTION  Family History  Problem Relation Age of Onset   Stroke Mother    Diabetes Mother    Dementia Mother    Heart attack Mother    Heart attack Father    Diabetes Sister    Diabetes Brother     No Known Allergies      No data to display            CMP  No results found for: "NA", "K", "CL", "CO2", "GLUCOSE", "BUN", "CREATININE", "CALCIUM", "PROT", "ALBUMIN", "AST", "ALT", "ALKPHOS", "BILITOT", "GFRNONAA", "GFRAA"   No results found.     Assessment & Plan:   1. Varicose veins with pain Recommend:  The patient has had successful ablation of the previously incompetent saphenous venous system but still has persistent symptoms of pain and swelling that are having a negative impact on daily life and daily activities.  Patient should  undergo injection sclerotherapy to treat the residual varicosities.  The risks, benefits and alternative therapies were reviewed in detail with the patient.  All questions were answered.  The patient agrees to proceed with sclerotherapy at their convenience.  The patient will continue wearing the graduated compression stockings and using the over-the-counter pain medications to treat her symptoms.       Current Outpatient Medications on File Prior to Visit  Medication Sig Dispense Refill   Cholecalciferol (VITAMIN D3) 1.25 MG (50000 UT) CAPS Take 1 capsule by mouth once a week. (Patient not taking: Reported on 04/02/2022)     ondansetron (ZOFRAN ODT) 8 MG disintegrating tablet Take 1 tablet (8 mg total) by mouth every 8 (eight) hours as needed for nausea or vomiting. (Patient not taking: Reported on 12/25/2021) 15 tablet 0   No current facility-administered medications on file prior to visit.    There are no Patient Instructions on file for this visit. No follow-ups on file.   Kris Hartmann, NP

## 2022-08-16 ENCOUNTER — Other Ambulatory Visit: Payer: Self-pay | Admitting: Obstetrics and Gynecology

## 2022-08-16 DIAGNOSIS — Z1231 Encounter for screening mammogram for malignant neoplasm of breast: Secondary | ICD-10-CM

## 2022-08-22 ENCOUNTER — Ambulatory Visit
Admission: RE | Admit: 2022-08-22 | Discharge: 2022-08-22 | Disposition: A | Discharge status: Home / Self Care | Payer: BC Managed Care – PPO | Source: Ambulatory Visit | Attending: Obstetrics and Gynecology | Admitting: Obstetrics and Gynecology

## 2022-08-22 DIAGNOSIS — Z1231 Encounter for screening mammogram for malignant neoplasm of breast: Secondary | ICD-10-CM | POA: Diagnosis not present

## 2022-09-12 IMAGING — MG MM DIGITAL SCREENING BILAT W/ TOMO AND CAD
8 series · 9 of 24 positions shown · non-contrast
Comparison: Previous exam(s).

CLINICAL DATA: Screening.

EXAM:
DIGITAL SCREENING BILATERAL MAMMOGRAM WITH TOMOSYNTHESIS AND CAD
TECHNIQUE: Bilateral screening digital craniocaudal and mediolateral oblique
mammograms were obtained. Bilateral screening digital breast
tomosynthesis was performed. The images were evaluated with
computer-aided detection.

[L CC synth-2D]
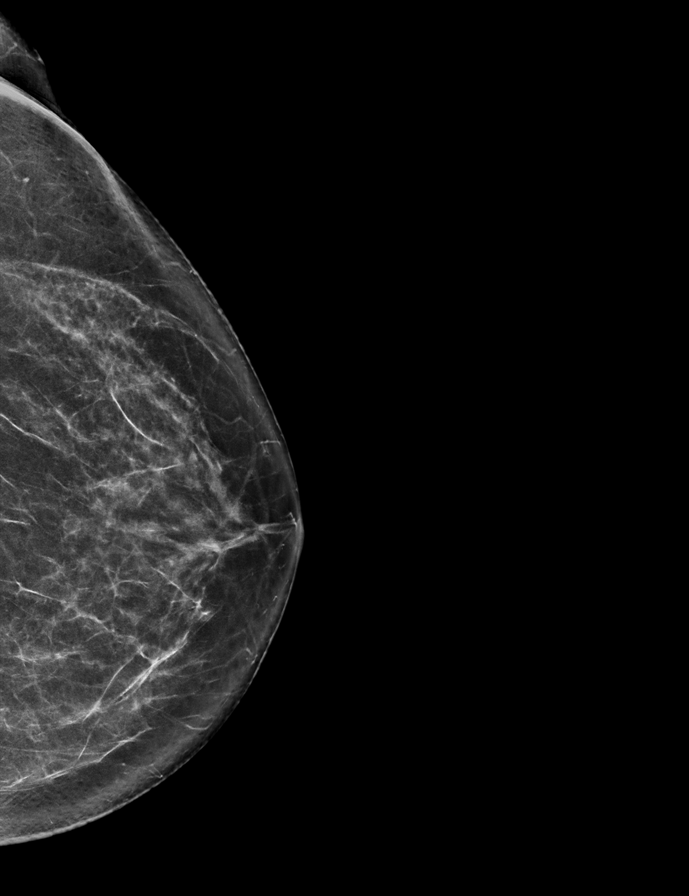

[L MLO synth-2D]
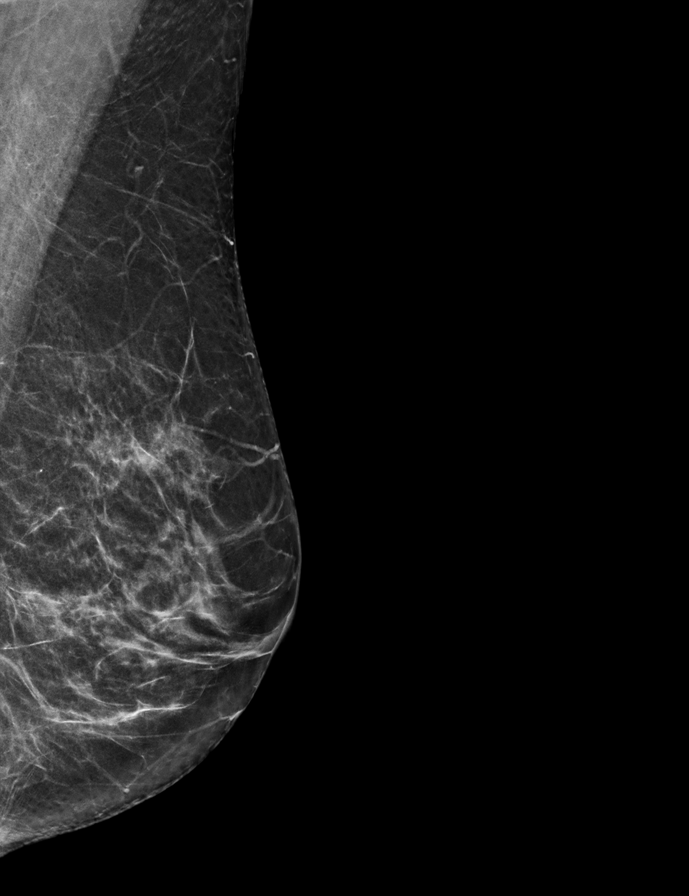

[R CC synth-2D]
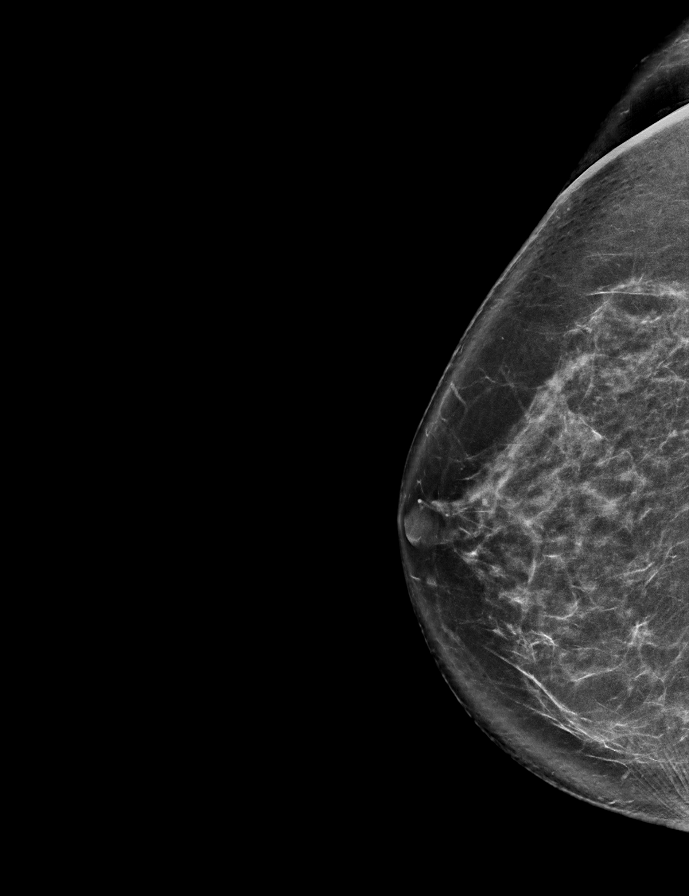

[R MLO synth-2D]
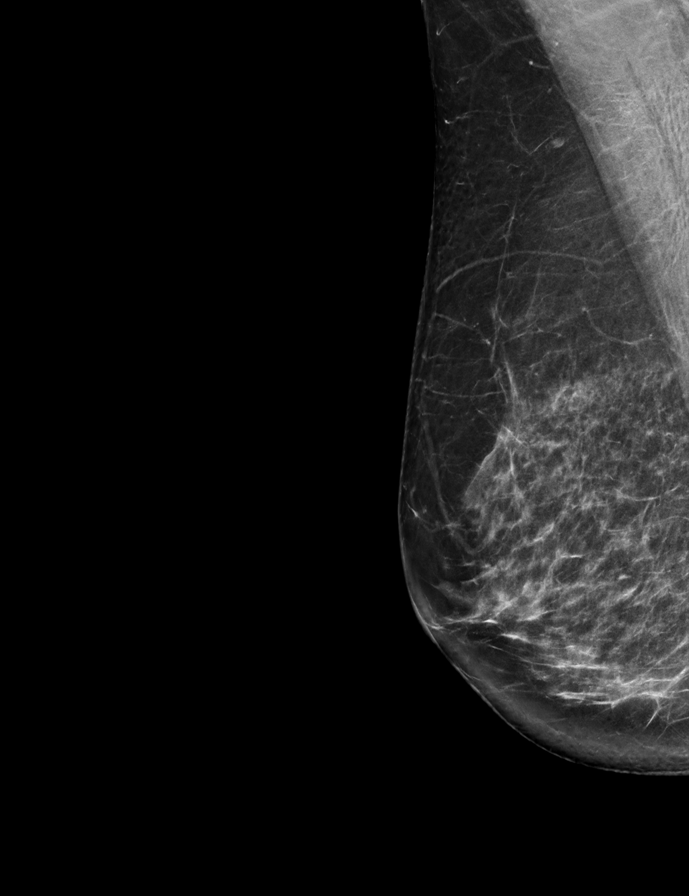

[R MLO tomo · 2 of 69 frames shown]
[frame 23/69]
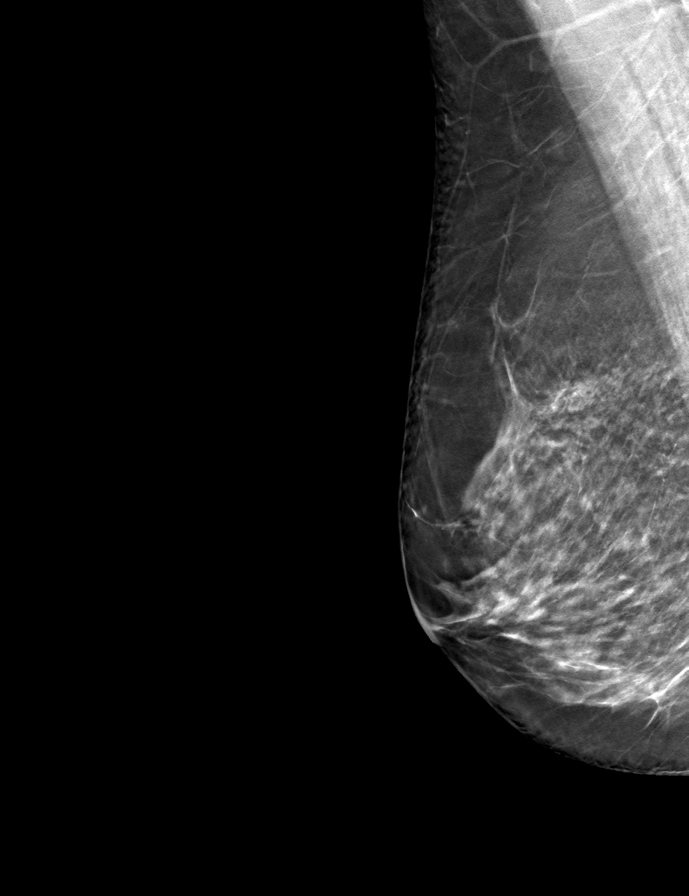
[frame 35/69]
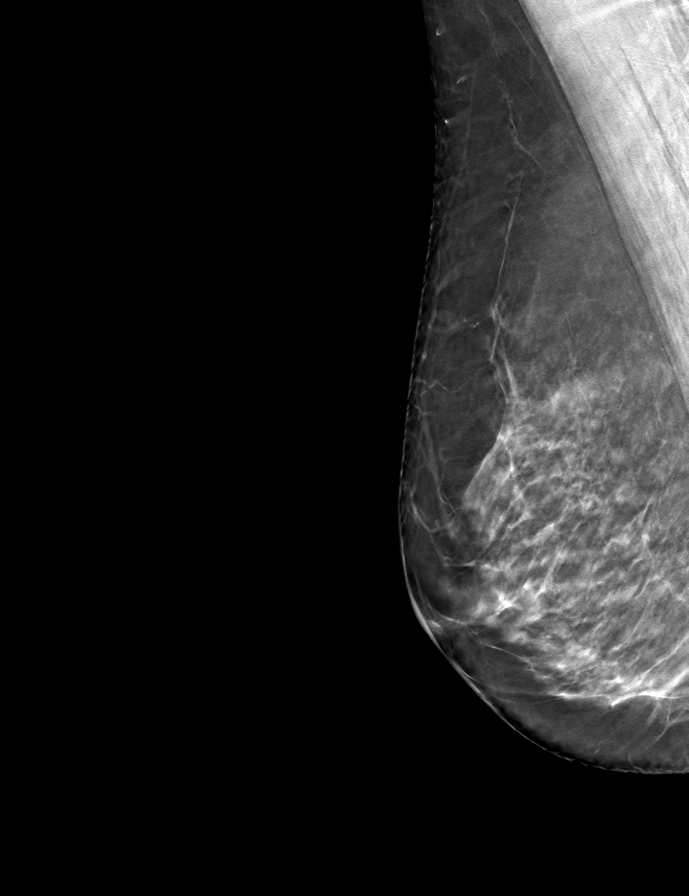

[R CC tomo · tomo slice 39/77.0]
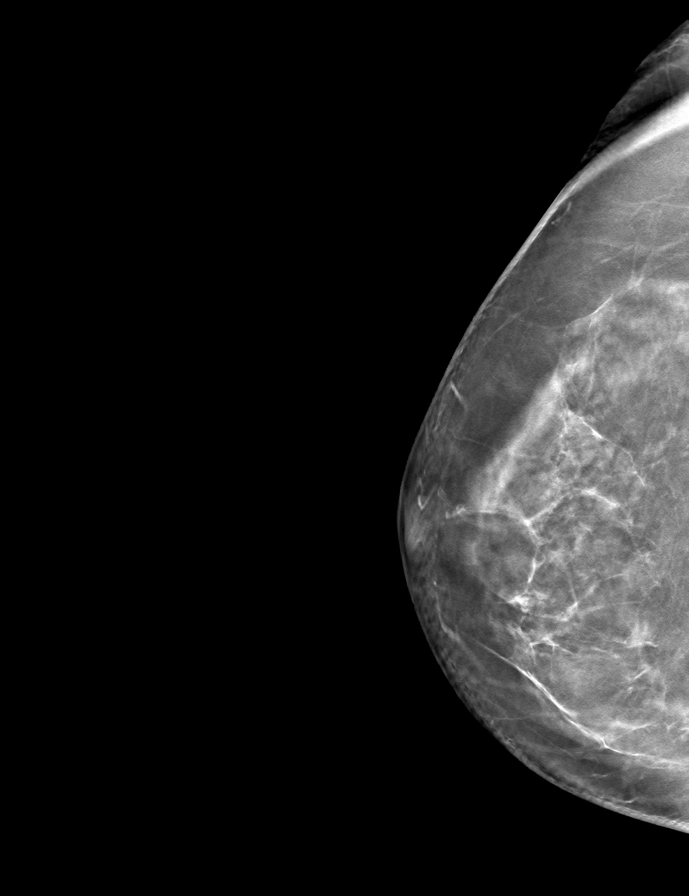

[L MLO tomo · tomo slice 33/64.0]
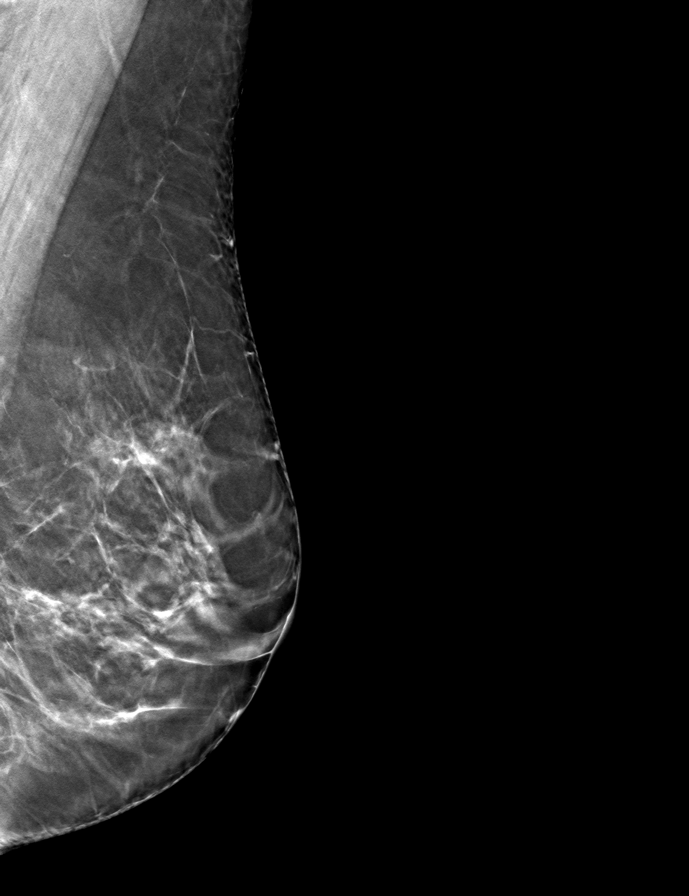

[L CC tomo · tomo slice 36/71.0]
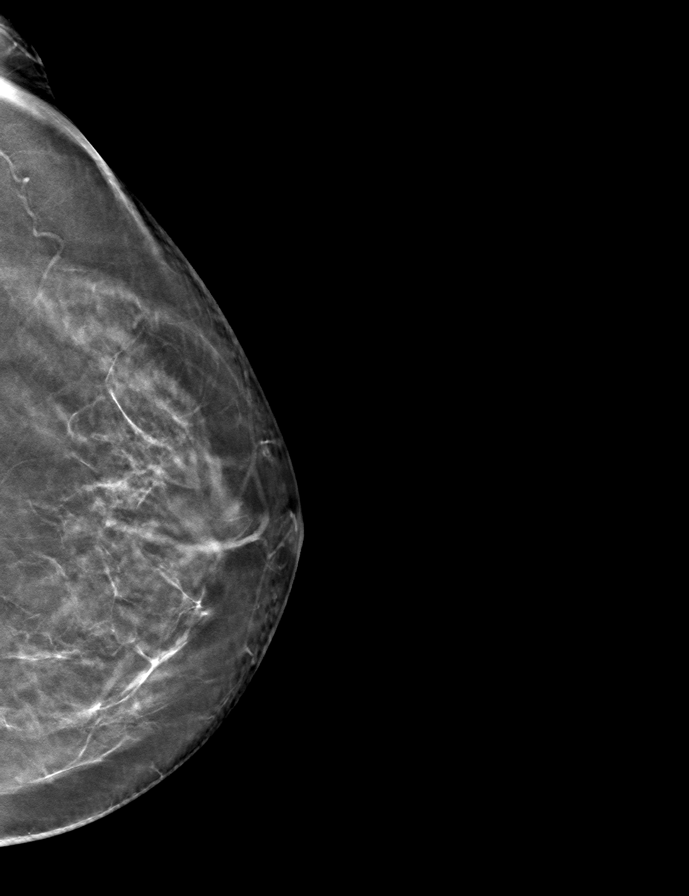

[9 of 24 positions shown; findings below may reference images not displayed]

ACR Breast Density Category b: There are scattered areas of
fibroglandular density.
FINDINGS: There are no findings suspicious for malignancy.
IMPRESSION: No mammographic evidence of malignancy. A result letter of this
screening mammogram will be mailed directly to the patient.

RECOMMENDATION:
Screening mammogram in one year. (Code:51-O-LD2)

BI-RADS CATEGORY  1: Negative.

## 2022-11-06 DIAGNOSIS — I890 Lymphedema, not elsewhere classified: Secondary | ICD-10-CM | POA: Diagnosis not present

## 2022-11-06 DIAGNOSIS — Z01419 Encounter for gynecological examination (general) (routine) without abnormal findings: Secondary | ICD-10-CM | POA: Diagnosis not present

## 2022-11-06 DIAGNOSIS — H6520 Chronic serous otitis media, unspecified ear: Secondary | ICD-10-CM | POA: Diagnosis not present

## 2023-09-13 ENCOUNTER — Other Ambulatory Visit: Payer: Self-pay | Admitting: Obstetrics and Gynecology

## 2023-09-13 DIAGNOSIS — Z1231 Encounter for screening mammogram for malignant neoplasm of breast: Secondary | ICD-10-CM

## 2023-09-20 ENCOUNTER — Ambulatory Visit (INDEPENDENT_AMBULATORY_CARE_PROVIDER_SITE_OTHER): Payer: BC Managed Care – PPO | Admitting: Nurse Practitioner

## 2023-09-20 ENCOUNTER — Encounter (INDEPENDENT_AMBULATORY_CARE_PROVIDER_SITE_OTHER): Payer: Self-pay | Admitting: Nurse Practitioner

## 2023-09-20 VITALS — BP 135/91 | HR 97 | Resp 18 | Ht 67.0 in | Wt 153.4 lb

## 2023-09-20 DIAGNOSIS — M79605 Pain in left leg: Secondary | ICD-10-CM

## 2023-09-20 DIAGNOSIS — R519 Headache, unspecified: Secondary | ICD-10-CM

## 2023-09-20 NOTE — Progress Notes (Signed)
 Subjective:    Patient ID: Sierra Hart, female    DOB: [birth year], 58 y.o.   MRN: [id number] Chief Complaint  Patient presents with   Follow-up    Fu left leg laser    The patient is a 58 year old female who returns today with concerns about pain in the left lower extremity.  She notes that this has been ongoing for the last few months.  She gets pain that radiates down her left leg and also some associated pain in the groin area.  She had an endovenous laser ablation of her left lower extremity approximately a year ago she continues to have some issues with lower back pain as well.    Review of Systems  Cardiovascular:  Positive for leg swelling.  Musculoskeletal:  Positive for back pain.  Neurological:  Positive for headaches.  All other systems reviewed and are negative.      Objective:   Physical Exam Vitals reviewed.  HENT:     Head: Normocephalic.  Cardiovascular:     Rate and Rhythm: Normal rate.     Pulses:          Dorsalis pedis pulses are 1+ on the right side and 1+ on the left side.  Pulmonary:     Effort: Pulmonary effort is normal.  Skin:    General: Skin is warm and dry.  Neurological:     Mental Status: She is alert and oriented to person, place, and time.     BP (!) 135/91   Pulse 97   Resp 18   Ht 5\' 7"  (1.702 m)   Wt 153 lb 6.4 oz (69.6 kg)   BMI 24.03 kg/m   History reviewed. No pertinent past medical history.  Social History   Socioeconomic History   Marital status: Married    Spouse name: Not on file   Number of children: Not on file   Years of education: Not on file   Highest education level: Not on file  Occupational History   Not on file  Tobacco Use   Smoking status: Every Day    Types: Cigarettes   Smokeless tobacco: Never  Vaping Use   Vaping status: Never Used  Substance and Sexual Activity   Alcohol use: Yes   Drug use: Not Currently   Sexual activity: Not on file  Other Topics Concern   Not on file  Social  History Narrative   Not on file   Social Drivers of Health   Financial Resource Strain: Not on file  Food Insecurity: Not on file  Transportation Needs: Not on file  Physical Activity: Not on file  Stress: Not on file  Social Connections: Unknown (12/05/2021)   Received from [facility], [facility]   Social Network    Social Network: Not on file  Intimate Partner Violence: Unknown (10/27/2021)   Received from [facility], [facility]   HITS    Physically Hurt: Not on file    Insult or Talk Down To: Not on file    Threaten Physical Harm: Not on file    Scream or Curse: Not on file    Past Surgical History:  Procedure Laterality Date   WISDOM TOOTH EXTRACTION      Family History  Problem Relation Age of Onset   Stroke Mother    Diabetes Mother    Dementia Mother    Heart attack Mother    Heart attack Father    Diabetes Sister    Diabetes Brother  Breast cancer Neg Hx     No Known Allergies      No data to display            CMP  No results found for: "NA", "K", "CL", "CO2", "GLUCOSE", "BUN", "CREATININE", "CALCIUM", "PROT", "ALBUMIN", "AST", "ALT", "ALKPHOS", "BILITOT", "GFR", "EGFR", "GFRNONAA"   No results found.     Assessment & Plan:   1. Left leg pain (Primary)  Recommend:  The patient has atypical pain symptoms for pure atherosclerotic disease. However, on physical exam there is evidence of vascular disease, given the diminished pulses and the edema of the legs.  Further investigation of the patient's vascular disease is necessary to determine the relationship of the patient's lower extremity symptoms and the degree of vascular disease.  Noninvasive studies of the will be obtained and the patient will follow up with me to review these studies.  I suspect the patient is c/o pseudoclaudication.  Patient should have an evaluation of his LS spine which I defer to either the primary service, pain management or the Spine service.  The  patient should continue walking and begin a more formal exercise program. The patient should continue his antiplatelet therapy and aggressive treatment of the lipid abnormalities.  2. Acute nonintractable headache, unspecified headache type She has been having some headaches recently.  There is some concern that there may be carotid level disease.  Will evaluate when patient follows up with noninvasive studies.   Current Outpatient Medications on File Prior to Visit  Medication Sig Dispense Refill   Cholecalciferol (VITAMIN D3) 1.25 MG (50000 UT) CAPS Take 1 capsule by mouth once a week.     ondansetron (ZOFRAN ODT) 8 MG disintegrating tablet Take 1 tablet (8 mg total) by mouth every 8 (eight) hours as needed for nausea or vomiting. (Patient not taking: Reported on 12/25/2021) 15 tablet 0   No current facility-administered medications on file prior to visit.    There are no Patient Instructions on file for this visit. No follow-ups on file.   Georgiana Spinner, NP

## 2023-09-23 ENCOUNTER — Ambulatory Visit
Admission: RE | Admit: 2023-09-23 | Discharge: 2023-09-23 | Disposition: A | Discharge status: Home / Self Care | Payer: BC Managed Care – PPO | Source: Ambulatory Visit | Attending: Obstetrics and Gynecology | Admitting: Obstetrics and Gynecology

## 2023-09-23 DIAGNOSIS — Z1231 Encounter for screening mammogram for malignant neoplasm of breast: Secondary | ICD-10-CM | POA: Diagnosis not present

## 2023-10-30 ENCOUNTER — Other Ambulatory Visit (INDEPENDENT_AMBULATORY_CARE_PROVIDER_SITE_OTHER): Payer: Self-pay | Admitting: Nurse Practitioner

## 2023-10-30 DIAGNOSIS — R519 Headache, unspecified: Secondary | ICD-10-CM

## 2023-10-30 DIAGNOSIS — M79605 Pain in left leg: Secondary | ICD-10-CM

## 2023-11-01 ENCOUNTER — Ambulatory Visit (INDEPENDENT_AMBULATORY_CARE_PROVIDER_SITE_OTHER): Payer: BC Managed Care – PPO

## 2023-11-01 ENCOUNTER — Encounter (INDEPENDENT_AMBULATORY_CARE_PROVIDER_SITE_OTHER): Payer: Self-pay | Admitting: Nurse Practitioner

## 2023-11-01 ENCOUNTER — Ambulatory Visit (INDEPENDENT_AMBULATORY_CARE_PROVIDER_SITE_OTHER): Payer: BC Managed Care – PPO | Admitting: Nurse Practitioner

## 2023-11-01 VITALS — BP 129/90 | HR 70 | Resp 16 | Wt 150.6 lb

## 2023-11-01 DIAGNOSIS — R519 Headache, unspecified: Secondary | ICD-10-CM

## 2023-11-01 DIAGNOSIS — M79605 Pain in left leg: Secondary | ICD-10-CM | POA: Diagnosis not present

## 2023-11-04 ENCOUNTER — Encounter (INDEPENDENT_AMBULATORY_CARE_PROVIDER_SITE_OTHER): Payer: Self-pay | Admitting: Nurse Practitioner

## 2023-11-04 NOTE — Progress Notes (Signed)
 Subjective:    Patient ID: Sierra Hart, female    DOB: 05/19/1966, 58 y.o.   MRN: [id number] Chief Complaint  Patient presents with   Follow-up    Pt conv abi and venous reflux    The patient is a 58 year old female who returns today with concerns about pain in the left lower extremity.  She notes that this has been ongoing for the last few months.  She gets pain that radiates down her left leg and also some associated pain in the groin area.  She had an endovenous laser ablation of her left lower extremity approximately a year ago she continues to have some issues with lower back pain as well.  Today the patient has ABI 1.32 on the right and 1.39 on the left.  She has strong triphasic tibial artery waveforms with normal toe waveforms bilaterally.  In addition she has no evidence of DVT or superficial thrombophlebitis in the left lower extremity and her previous left lower extremity ablation is intact.  The patient has no evidence of any significant stenosis in the bilateral internal carotid arteries.  Her vertebral arteries demonstrate antegrade flow with normal flow hemodynamics seen in the bilateral subclavian arteries.    Review of Systems  Cardiovascular:  Positive for leg swelling.  Musculoskeletal:  Positive for back pain.  Neurological:  Positive for headaches.  All other systems reviewed and are negative.      Objective:   Physical Exam Vitals reviewed.  HENT:     Head: Normocephalic.  Cardiovascular:     Rate and Rhythm: Normal rate.     Pulses:          Dorsalis pedis pulses are 1+ on the right side and 1+ on the left side.  Pulmonary:     Effort: Pulmonary effort is normal.  Skin:    General: Skin is warm and dry.  Neurological:     Mental Status: She is alert and oriented to person, place, and time.     BP (!) 129/90   Pulse 70   Resp 16   Wt 150 lb 9.6 oz (68.3 kg)   BMI 23.59 kg/m   No past medical history on file.  Social History    Socioeconomic History   Marital status: Married    Spouse name: Not on file   Number of children: Not on file   Years of education: Not on file   Highest education level: Not on file  Occupational History   Not on file  Tobacco Use   Smoking status: Every Day    Types: Cigarettes   Smokeless tobacco: Never  Vaping Use   Vaping status: Never Used  Substance and Sexual Activity   Alcohol use: Yes   Drug use: Not Currently   Sexual activity: Not on file  Other Topics Concern   Not on file  Social History Narrative   Not on file   Social Drivers of Health   Financial Resource Strain: Not on file  Food Insecurity: Not on file  Transportation Needs: Not on file  Physical Activity: Not on file  Stress: Not on file  Social Connections: Unknown (12/05/2021)   Received from [facility], [facility]   Social Network    Social Network: Not on file  Intimate Partner Violence: Unknown (10/27/2021)   Received from [facility], [facility]   HITS    Physically Hurt: Not on file    Insult or Talk Down To: Not on file  Threaten Physical Harm: Not on file    Scream or Curse: Not on file    Past Surgical History:  Procedure Laterality Date   WISDOM TOOTH EXTRACTION      Family History  Problem Relation Age of Onset   Stroke Mother    Diabetes Mother    Dementia Mother    Heart attack Mother    Heart attack Father    Diabetes Sister    Diabetes Brother    Breast cancer Neg Hx     No Known Allergies      No data to display            CMP  No results found for: "NA", "K", "CL", "CO2", "GLUCOSE", "BUN", "CREATININE", "CALCIUM", "PROT", "ALBUMIN", "AST", "ALT", "ALKPHOS", "BILITOT", "GFR", "EGFR", "GFRNONAA"   No results found.     Assessment & Plan:   1. Left leg pain (Primary) Recommend:  The patient has atypical pain symptoms for vascular disease and on exam I do not find evidence of vascular pathology that would explain the patient's symptoms.   Noninvasive studies do not identify significant vascular problems  I suspect the patient is c/o pseudoclaudication.  Patient should have an evaluation of the LS spine which I defer to the primary service, pain management or the Spine service.  We offered a referral today but patient notes that she will discuss with her PCP at upcoming visit.  The patient should continue walking and begin a more formal exercise program. The patient should continue his antiplatelet therapy and aggressive treatment of the lipid abnormalities.  Patient will follow-up with me on a PRN basis.  2. Acute nonintractable headache, unspecified headache type Based on noninvasive studies today, her headaches would not be attributed to her vascular status.  Patient will continue to follow with PCP for further workup and evaluation.   Current Outpatient Medications on File Prior to Visit  Medication Sig Dispense Refill   Cholecalciferol (VITAMIN D3) 1.25 MG (50000 UT) CAPS Take 1 capsule by mouth once a week.     ondansetron (ZOFRAN ODT) 8 MG disintegrating tablet Take 1 tablet (8 mg total) by mouth every 8 (eight) hours as needed for nausea or vomiting. (Patient not taking: Reported on 11/01/2023) 15 tablet 0   No current facility-administered medications on file prior to visit.    There are no Patient Instructions on file for this visit. No follow-ups on file.   Dariann Huckaba E Kanyon Bunn, NP

## 2023-11-06 LAB — VAS US ABI WITH/WO TBI
Left ABI: 1.29
Right ABI: 1.32

## 2023-12-10 ENCOUNTER — Encounter (INDEPENDENT_AMBULATORY_CARE_PROVIDER_SITE_OTHER): Payer: Self-pay

## 2023-12-26 DIAGNOSIS — Z0189 Encounter for other specified special examinations: Secondary | ICD-10-CM | POA: Diagnosis not present

## 2024-01-02 DIAGNOSIS — Z043 Encounter for examination and observation following other accident: Secondary | ICD-10-CM | POA: Diagnosis not present

## 2024-02-07 DIAGNOSIS — H2513 Age-related nuclear cataract, bilateral: Secondary | ICD-10-CM | POA: Diagnosis not present

## 2024-02-07 DIAGNOSIS — H43813 Vitreous degeneration, bilateral: Secondary | ICD-10-CM | POA: Diagnosis not present

## 2024-02-07 DIAGNOSIS — H43391 Other vitreous opacities, right eye: Secondary | ICD-10-CM | POA: Diagnosis not present

## 2024-03-02 DIAGNOSIS — M26601 Right temporomandibular joint disorder, unspecified: Secondary | ICD-10-CM | POA: Diagnosis not present

## 2024-04-02 NOTE — Progress Notes (Signed)
 ANNUAL PREVENTATIVE CARE GYNECOLOGY  ENCOUNTER NOTE  Subjective:       Sierra Hart is a 58 y.o. G2P2 female here for a routine annual gynecologic exam. The patient is sexually active. The patient is not taking hormone replacement therapy. Patient denies post-menopausal vaginal bleeding. The patient wears seatbelts: yes. The patient participates in regular exercise: no. Has the patient ever been transfused or tattooed?: no. The patient reports that there is not domestic violence in her life.  Current complaints: 1.  She has no new concerns today    Gynecologic History No LMP recorded. Patient is postmenopausal. Contraception: post menopausal status Last Pap: ~2023. Results were: normal Last mammogram: 09/23/2023. Results were: normal Last Colonoscopy: 04/27/2022: 10 years Last Dexa Scan: Not age appropriate   Obstetric History OB History  Gravida Para Term Preterm AB Living  2 2    2   SAB IAB Ectopic Multiple Live Births          # Outcome Date GA Lbr Len/2nd Weight Sex Type Anes PTL Lv  2 Para           1 Para             No past medical history on file.  Family History  Problem Relation Age of Onset   Stroke Mother    Diabetes Mother    Dementia Mother    Heart attack Mother    Heart attack Father    Diabetes Sister    Diabetes Brother    Breast cancer Neg Hx     Past Surgical History:  Procedure Laterality Date   WISDOM TOOTH EXTRACTION      Social History   Socioeconomic History   Marital status: Married    Spouse name: Not on file   Number of children: Not on file   Years of education: Not on file   Highest education level: Not on file  Occupational History   Not on file  Tobacco Use   Smoking status: Every Day    Current packs/day: 1.00    Types: Cigarettes   Smokeless tobacco: Never  Vaping Use   Vaping status: Never Used  Substance and Sexual Activity   Alcohol use: Yes    Comment: occassionally   Drug use: Not Currently    Sexual activity: Yes    Birth control/protection: Surgical  Other Topics Concern   Not on file  Social History Narrative   Not on file   Social Drivers of Health   Financial Resource Strain: Not on file  Food Insecurity: Not on file  Transportation Needs: Not on file  Physical Activity: Not on file  Stress: Not on file  Social Connections: Unknown (12/05/2021)   Received from [facility]   Social Network    Social Network: Not on file  Intimate Partner Violence: Unknown (10/27/2021)   Received from [facility]   HITS    Physically Hurt: Not on file    Insult or Talk Down To: Not on file    Threaten Physical Harm: Not on file    Scream or Curse: Not on file    Current Outpatient Medications on File Prior to Visit  Medication Sig Dispense Refill   Cholecalciferol (VITAMIN D3) 1.25 MG (50000 UT) CAPS Take 1 capsule by mouth once a week.     ondansetron  (ZOFRAN  ODT) 8 MG disintegrating tablet Take 1 tablet (8 mg total) by mouth every 8 (eight) hours as needed for nausea or vomiting. (Patient not taking:  Reported on 11/01/2023) 15 tablet 0   No current facility-administered medications on file prior to visit.    No Known Allergies    Review of Systems ROS Review of Systems - General ROS: negative for - chills, fatigue, fever, hot flashes, night sweats, weight gain or weight loss Psychological ROS: negative for - anxiety, decreased libido, depression, mood swings, physical abuse or sexual abuse Ophthalmic ROS: negative for - blurry vision, eye pain or loss of vision ENT ROS: negative for - headaches, hearing change, visual changes or vocal changes Allergy and Immunology ROS: negative for - hives, itchy/watery eyes or seasonal allergies Hematological and Lymphatic ROS: negative for - bleeding problems, bruising, swollen lymph nodes or weight loss Endocrine ROS: negative for - galactorrhea, hair pattern changes, hot flashes, malaise/lethargy, mood swings, palpitations,  polydipsia/polyuria, skin changes, temperature intolerance or unexpected weight changes Breast ROS: negative for - new or changing breast lumps or nipple discharge Respiratory ROS: negative for - cough or shortness of breath Cardiovascular ROS: negative for - chest pain, irregular heartbeat, palpitations or shortness of breath Gastrointestinal ROS: no abdominal pain, change in bowel habits, or black or bloody stools Genito-Urinary ROS: no dysuria, trouble voiding, or hematuria Musculoskeletal ROS: negative for - joint pain or joint stiffness Neurological ROS: negative for - bowel and bladder control changes Dermatological ROS: negative for rash and skin lesion changes   Objective:   Resp 16   Ht 5' 7 (1.702 m)   Wt 146 lb 6.4 oz (66.4 kg)   BMI 22.93 kg/m  CONSTITUTIONAL: Well-developed, well-nourished female in no acute distress.  PSYCHIATRIC: Normal mood and affect. Normal behavior. Normal judgment and thought content. NEUROLGIC: Alert and oriented to person, place, and time. Normal muscle tone coordination. No cranial nerve deficit noted.Sierra Hart  NECK: Normal range of motion, supple, no masses.  Normal thyroid.  SKIN: Skin is warm and dry. No rash noted. Not diaphoretic. No erythema. No pallor. BREASTS: Symmetric in size. No masses, skin changes, nipple drainage, or lymphadenopathy. ABDOMEN: Soft, normal bowel sounds, no distention noted.  No tenderness, rebound or guarding.  BLADDER: Normal PELVIC:  Bladder no bladder distension noted  Urethra: normal appearing urethra with no masses, tenderness or lesions  Vulva: normal appearing vulva with no masses, tenderness or lesions  Vagina: normal appearing vagina with normal color and discharge, no lesions  Cervix: normal appearing cervix without discharge or lesions  Uterus: uterus is normal size, shape, consistency and nontender  Adnexa: normal adnexa in size, nontender and no masses  RV: External Exam NormaI and No Rectal Masses   MUSCULOSKELETAL: Normal range of motion. No tenderness.  No cyanosis     Assessment:   1. Encounter for well woman exam with routine gynecological exam   2. Cervical cancer screening      Plan:  Pap:Hx of abnormal Pap. Collected today with HPV Mammogram: UTD Colon Screening:  UTD Labs: UTD Routine preventative health maintenance measures emphasized: Exercise/Diet/Weight control, Tobacco Warnings, Alcohol/Substance use risks, Stress Management,  and Safe Sex Flu vaccine status: Declined COVID Vaccination status: Return to Clinic - 1 Year   Damien Parsley, [facility]

## 2024-04-06 ENCOUNTER — Other Ambulatory Visit (HOSPITAL_COMMUNITY)
Admission: RE | Admit: 2024-04-06 | Discharge: 2024-04-06 | Disposition: A | Discharge status: Home / Self Care | Source: Ambulatory Visit | Attending: Certified Nurse Midwife | Admitting: Certified Nurse Midwife

## 2024-04-06 ENCOUNTER — Ambulatory Visit (INDEPENDENT_AMBULATORY_CARE_PROVIDER_SITE_OTHER): Admitting: Certified Nurse Midwife

## 2024-04-06 ENCOUNTER — Encounter: Payer: Self-pay | Admitting: Certified Nurse Midwife

## 2024-04-06 VITALS — Resp 16 | Ht 67.0 in | Wt 146.4 lb

## 2024-04-06 DIAGNOSIS — Z01419 Encounter for gynecological examination (general) (routine) without abnormal findings: Secondary | ICD-10-CM | POA: Diagnosis not present

## 2024-04-06 DIAGNOSIS — Z124 Encounter for screening for malignant neoplasm of cervix: Secondary | ICD-10-CM | POA: Diagnosis not present

## 2024-04-09 LAB — CYTOLOGY - PAP
Chlamydia: NEGATIVE
Comment: NEGATIVE
Comment: NORMAL
Diagnosis: NEGATIVE
Neisseria Gonorrhea: NEGATIVE
